# Patient Record
Sex: Male | Born: 1995 | Race: White | Hispanic: No | Marital: Single | Smoking: Current every day smoker
Health system: Southern US, Community
[De-identification: ages and names within clinical notes are randomized; demographics above are authoritative.]

## PROBLEM LIST (undated history)

## (undated) HISTORY — PX: KNEE ARTHROSCOPY: SHX127

## (undated) HISTORY — PX: KNEE SURGERY: SHX244

---
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
## 2018-07-14 ENCOUNTER — Ambulatory Visit (INDEPENDENT_AMBULATORY_CARE_PROVIDER_SITE_OTHER): Payer: Commercial Managed Care - PPO | Admitting: Nurse Practitioner

## 2018-07-14 ENCOUNTER — Encounter: Payer: Self-pay | Admitting: Nurse Practitioner

## 2018-07-14 VITALS — BP 136/87 | HR 62 | Temp 98.3°F | Resp 16 | Ht 71.0 in | Wt 163.0 lb

## 2018-07-14 DIAGNOSIS — F321 Major depressive disorder, single episode, moderate: Secondary | ICD-10-CM

## 2018-07-14 DIAGNOSIS — F5104 Psychophysiologic insomnia: Secondary | ICD-10-CM | POA: Diagnosis not present

## 2018-07-14 DIAGNOSIS — Z7689 Persons encountering health services in other specified circumstances: Secondary | ICD-10-CM | POA: Diagnosis not present

## 2018-07-14 MED ORDER — ESCITALOPRAM OXALATE 10 MG PO TABS: 10.0000 mg | ORAL_TABLET | Freq: Every day | ORAL | 1 refills | Status: DC
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
## 2018-07-14 NOTE — Patient Instructions (Addendum)
Thomas Barber,   Thank you for coming in to clinic today.  1. START escitalopram 10 mg tablet.  Take 1/2 tablet once daily for 7 days.  Then, increase to 1 full tablet once daily and continue.   2. Work on sleep hygiene.  3. Exercise at least 30 minutes 5 days per week.  Please schedule a follow-up appointment with Wilhelmina Mcardle, AGNP.  Return in about 6 weeks (around 08/25/2018) for depression.  If you have any other questions or concerns, please feel free to call the clinic or send a message through MyChart. You may also schedule an earlier appointment if necessary.  You will receive a survey after today's visit either digitally by e-mail or paper by [employer]. Your experiences and feedback matter to [place].  Please respond so we know how we are doing as we provide care for you.  Wilhelmina Mcardle, DNP, AGNP-BC Adult Gerontology Nurse Practitioner [facility]    Sleep Hygiene Tips  Take medicines only as directed by your health care provider.  Keep regular sleeping and waking hours. Avoid naps.  Keep a sleep diary to help you and your health care provider figure out what could be causing your insomnia. Include:  When you sleep.  When you wake up during the night.  How well you sleep.  How rested you feel the next day.  Any side effects of medicines you are taking.  What you eat and drink.  Make your bedroom a comfortable place where it is easy to fall asleep:  Put up shades or special blackout curtains to block light from outside.  Use a white noise machine to block noise.  Keep the temperature cool.  Exercise regularly as directed by your health care provider. Avoid exercising right before bedtime.  Use relaxation techniques to manage stress. Ask your health care provider to suggest some techniques that may work well for you. These may include:  Breathing exercises.  Routines to release muscle tension.  Visualizing peaceful  scenes.  Cut back on alcohol, caffeinated beverages, and cigarettes, especially close to bedtime. These can disrupt your sleep.  Do not overeat or eat spicy foods right before bedtime. This can lead to digestive discomfort that can make it hard for you to sleep.  Limit screen use before bedtime. This includes:  Watching TV.  Using your smartphone, tablet, and computer.  Stick to a routine. This can help you fall asleep faster. Try to do a quiet activity, brush your teeth, and go to bed at the same time each night.  Get out of bed if you are still awake after 15 minutes of trying to sleep. Keep the lights down, but try reading or doing a quiet activity. When you feel sleepy, go back to bed.  Make sure that you drive carefully. Avoid driving if you feel very sleepy.  Keep all follow-up appointments as directed by your health care provider. This is important.     Serotonin Syndrome Serotonin is a brain chemical that regulates the nervous system, which includes the brain, spinal cord, and nerves. Serotonin appears to play a role in all types of behavior, including appetite, emotions, movement, thinking, and response to stress. Excessively high levels of serotonin in the body can cause serotonin syndrome, which is a very dangerous condition. What are the causes? This condition can be caused by taking medicines or drugs that increase the level of serotonin in your body. These include:  Antidepressant medicines.  Migraine medicines.  Certain pain medicines.  Certain recreational drugs, including ecstasy, LSD, cocaine, and amphetamines.  Over-the-counter cough or cold medicines that contain dextromethorphan.  Certain herbal supplements, including St. John's wort, ginseng, and nutmeg.  This condition usually occurs when you take these medicines or drugs in combination, but it can also happen with a high dose of a single medicine or drug. What increases the risk? This condition is  more likely to develop in:  People who have recently increased the dosage of medicine that increases the serotonin level.  People who just started taking medicine that increases the serotonin level.  What are the signs or symptoms? Symptoms of this condition usually happens within several hours of a medicine change. Symptoms include:  Headache.  Muscle twitching or stiffness.  Diarrhea.  Confusion.  Restlessness or agitation.  Shivering or goose bumps.  Loss of muscle coordination.  Rapid heart rate.  Sweating.  Severe cases of serotonin syndromecan cause:  Irregular heartbeat.  Seizures.  Loss of consciousness.  High fever.  How is this diagnosed? This condition is diagnosed with a medical history and physical exam. You will be asked aboutyour symptoms and your use of medicines and recreational drugs. Your health care provider may also order lab work or additional tests to rule out other causes of your symptoms. How is this treated? The treatment for this condition depends on the severity of your symptoms. For mild cases, stopping the medicine that caused your condition is usually all that is needed. For moderate to severe cases, hospitalization is required to monitor you and to prevent further muscle damage. Follow these instructions at home:  Take over-the-counter and prescription medicines only as told by your health care provider. This is important.  Check with your health care provider before you start taking any new prescriptions, over-the-counter medicines, herbs, or supplements.  Avoid combining any medicines that can cause this condition to occur.  Keep all follow-up visits as told by your health care provider.This is important.  Maintain a healthy lifestyle. ? Eat healthy foods. ? Get plenty of sleep. ? Exercise regularly. ? Do not drink alcohol. ? Do not use recreational drugs. Contact a health care provider if:  Medicines do not seem to be  helping.  Your symptoms do not improve or they get worse.  You have trouble taking care of yourself. Get help right away if:  You have worsening confusion, severe headache, chest pain, high fever, seizures, or loss of consciousness.  You have serious thoughts about hurting yourself or others.  You experience serious side effects of medicine, such as swelling of your face, lips, tongue, or throat. This information is not intended to replace advice given to you by your health care provider. Make sure you discuss any questions you have with your health care provider. Document Released: 11/06/2004 Document Revised: 05/24/2016 Document Reviewed: 10/12/2014 Elsevier Interactive Patient Education  [employer].

## 2018-07-15 ENCOUNTER — Encounter (INDEPENDENT_AMBULATORY_CARE_PROVIDER_SITE_OTHER): Payer: Self-pay

## 2018-08-09 ENCOUNTER — Ambulatory Visit: Payer: Commercial Managed Care - PPO | Admitting: Psychology

## 2018-08-24 ENCOUNTER — Other Ambulatory Visit: Payer: Self-pay

## 2018-08-24 ENCOUNTER — Encounter: Payer: Self-pay | Admitting: Nurse Practitioner

## 2018-08-24 ENCOUNTER — Ambulatory Visit (INDEPENDENT_AMBULATORY_CARE_PROVIDER_SITE_OTHER): Payer: Commercial Managed Care - PPO | Admitting: Nurse Practitioner

## 2018-08-24 VITALS — BP 128/69 | HR 71 | Temp 98.3°F | Ht 71.0 in | Wt 164.4 lb

## 2018-08-24 DIAGNOSIS — Z23 Encounter for immunization: Secondary | ICD-10-CM

## 2018-08-24 DIAGNOSIS — F5104 Psychophysiologic insomnia: Secondary | ICD-10-CM

## 2018-08-24 DIAGNOSIS — F321 Major depressive disorder, single episode, moderate: Secondary | ICD-10-CM | POA: Diagnosis not present

## 2018-08-24 MED ORDER — ESCITALOPRAM OXALATE 5 MG PO TABS: 5.0000 mg | ORAL_TABLET | Freq: Every day | ORAL | 2 refills | Status: DC

## 2018-08-24 MED ORDER — HYDROXYZINE HCL 25 MG PO TABS: 12.5000 mg | ORAL_TABLET | Freq: Every evening | ORAL | 1 refills | Status: DC | PRN

## 2018-08-24 NOTE — Progress Notes (Signed)
Subjective:    Patient ID: Thomas Barber, male    DOB: 05/20/1996, 22 y.o.   MRN: [id number]  Thomas Barber is a 22 y.o. male presenting on 08/24/2018 for Depression   HPI Depression and Insomnia - Doesn't want to do anything - Feels like he zones out and doesn't have to be present, no interest in things he does - Quickly gets bored with things especially that are mastered.   - Job is not any longer challenging. - Is becoming more anxious.   - Notes an occasional sense of impending doom at work.  - Mind continues racing at bedtime.  Doesn't notice any difficulty during the day.  This prevents good sleep and has difficulty with sleep onset and going back to sleep with nighttime awakenings. - Started Lexapro and has had severe diarrhea with increase to 10 mg daily.  He has taken this only 2-3 per week and is not willing to continue if diarrhea continues.  Depression screen [employer] 2/9 08/24/2018 07/14/2018  Decreased Interest 2 3  Down, Depressed, Hopeless 2 2  PHQ - 2 Score 4 5  Altered sleeping 3 3  Tired, decreased energy 1 3  Change in appetite 0 1  Feeling bad or failure about yourself  1 2  Trouble concentrating 2 1  Moving slowly or fidgety/restless 2 2  Suicidal thoughts 0 1  PHQ-9 Score 13 18  Difficult doing work/chores Very difficult Somewhat difficult    GAD 7 : Generalized Anxiety Score 08/24/2018 08/24/2018 07/14/2018  Nervous, Anxious, on Edge - 1 0  Control/stop worrying - 1 1  Worry too much - different things - 1 0  Trouble relaxing 1 - 2  Restless - 0 0  Easily annoyed or irritable - 3 1  Afraid - awful might happen 2 2 0  Total GAD 7 Score - - 4  Anxiety Difficulty - Somewhat difficult Not difficult at all      Social History   Tobacco Use  . Smoking status: Current Every Day Smoker  . Smokeless tobacco: Current User  Substance Use Topics  . Alcohol use: Yes  . Drug use: Yes    Types: Marijuana    Review of Systems Per HPI unless  specifically indicated above     Objective:    BP 128/69 (BP Location: Right Arm, Patient Position: Sitting, Cuff Size: Normal)   Pulse 71   Temp 98.3 F (36.8 C) (Oral)   Ht 5\' 11"  (1.803 m)   Wt 164 lb 6.4 oz (74.6 kg)   BMI 22.93 kg/m   Wt Readings from Last 3 Encounters:  08/24/18 164 lb 6.4 oz (74.6 kg)  07/14/18 163 lb (73.9 kg)    Physical Exam  Constitutional: He is oriented to person, place, and time. He appears well-developed and well-nourished. No distress.  HENT:  Head: Normocephalic and atraumatic.  Cardiovascular: Normal rate, regular rhythm, S1 normal, S2 normal, normal heart sounds and intact distal pulses.  Pulmonary/Chest: Effort normal and breath sounds normal. No respiratory distress.  Neurological: He is alert and oriented to person, place, and time.  Skin: Skin is warm and dry. Capillary refill takes less than 2 seconds.  Psychiatric: Judgment and thought content normal. He is withdrawn. He exhibits a depressed mood. He expresses no homicidal and no suicidal ideation. He expresses no suicidal plans and no homicidal plans. He is communicative.  Patient is mildly less depressed and is less withdrawn.  Both continue to be predominant mood and  behaviors. He is attentive.  Vitals reviewed.      Assessment & Plan:   Problem List Items Addressed This Visit      Other   Current moderate episode of major depressive disorder without prior episode (HCC) - Primary Small improvement in depression, but continues to be uncontrolled and without fully tolerating lexapro.  Not consistently dosing this.  Resolution of SI.  Denies SI/HI and has no plans to carry out if SI/HI arise.   Plan: 1. REDUCE lexapro to 5 mg once daily - take daily and report back on diarrhea.  Can increase back to 10 mg daily if tolerated.  If not tolerated by GI system, will need alternative agent. 2. For increased anxiety, start hydroxyzine 25 mg 1/2-1 tab tidprn anxiety. 3. Encouraged regular  physical activity, hobbies as non-pharm ways to increase mood/outlook. 4. Follow-up 6 weeks.   Relevant Medications   escitalopram (LEXAPRO) 5 MG tablet   hydrOXYzine (ATARAX/VISTARIL) 25 MG tablet   Psychophysiological insomnia Stable, but not improving with mild improvement in depression.  Largely impacted by anxiety.  Plan: 1. START hydroxyzine as above for anxiety.  Instructed patient to take full 25 mg tab at bedtime to help with sleep onset prn. 2. Encouraged good sleep hygiene. 3. Encouraged exercise. 4. Follow-up 6 weeks.   Relevant Medications   hydrOXYzine (ATARAX/VISTARIL) 25 MG tablet    Other Visit Diagnoses    Needs flu shot     Pt < age 97.  Needs annual influenza vaccine.  Plan: 1. Administer Quad flu vaccine.    Relevant Orders   Flu Vaccine QUAD 6+ mos PF IM (Fluarix Quad PF) (Completed)      Meds ordered this encounter  Medications  . escitalopram (LEXAPRO) 5 MG tablet    Sig: Take 1 tablet (5 mg total) by mouth daily.    Dispense:  30 tablet    Refill:  2    Order Specific Question:   Supervising Provider    Answer:   Smitty Cords [[id number]]  . hydrOXYzine (ATARAX/VISTARIL) 25 MG tablet    Sig: Take 0.5-1 tablets (12.5-25 mg total) by mouth at bedtime as needed for anxiety (and sleep).    Dispense:  30 tablet    Refill:  1    Order Specific Question:   Supervising Provider    Answer:   Smitty Cords [[id number]]    Follow up plan: Return in about 6 weeks (around 10/05/2018) for depression.  Wilhelmina Mcardle, DNP, AGPCNP-BC Adult Gerontology Primary Care Nurse Practitioner [facility] 08/24/2018, 4:43 PM

## 2018-08-24 NOTE — Patient Instructions (Addendum)
Thomas Barber,   Thank you for coming in to clinic today.  1. For anxiety, start hydroxyzine 12.5 mg once daily.  2. For anxiety and depression, REDUCE lexapro to 5 mg tablet.  Take one tablet daily in the morning.  Take until you have no increased bowel movements.   Once you are taking your medicine daily and tolerating well, call clinic or send a mychart message for additional instructions for dose increases. - Increase your physical activity until you are increasing your heart rate for 30 minutes on most days of the week. - Start working toward your hobby of music again since that was previously enjoyable.  Please schedule a follow-up appointment with Wilhelmina Mcardle, AGNP. Return in about 6 weeks (around 10/05/2018) for depression.  If you have any other questions or concerns, please feel free to call the clinic or send a message through MyChart. You may also schedule an earlier appointment if necessary.  You will receive a survey after today's visit either digitally by e-mail or paper by [employer]. Your experiences and feedback matter to [place].  Please respond so we know how we are doing as we provide care for you.   Wilhelmina Mcardle, DNP, AGNP-BC Adult Gerontology Nurse Practitioner [facility], [facility]   Major Depressive Disorder, Adult Major depressive disorder (MDD) is a mental health condition. MDD often makes you feel sad, hopeless, or helpless. MDD can also cause symptoms in your body. MDD can affect your:  Work.  School.  Relationships.  Other normal activities.  MDD can range from mild to very bad. It may occur once (single episode MDD). It can also occur many times (recurrent MDD). The main symptoms of MDD often include:  Feeling sad, depressed, or irritable most of the time.  Loss of interest.  MDD symptoms also include:  Sleeping too much or too little.  Eating too much or too little.  A change in your weight.  Feeling tired (fatigue) or  having low energy.  Feeling worthless.  Feeling guilty.  Trouble making decisions.  Trouble thinking clearly.  Thoughts of suicide or harming others.  Feeling weak.  Feeling agitated.  Keeping yourself from being around other people (isolation).  Follow these instructions at home: Activity  Do these things as told by your doctor: ? Go back to your normal activities. ? Exercise regularly. ? Spend time outdoors. Alcohol  Talk with your doctor about how alcohol can affect your antidepressant medicines.  Do not drink alcohol. Or, limit how much alcohol you drink. ? This means no more than 1 drink a day for nonpregnant women and 2 drinks a day for men. One drink equals one of these:  12 oz of beer.  5 oz of wine.  1 oz of hard liquor. General instructions  Take over-the-counter and prescription medicines only as told by your doctor.  Eat a healthy diet.  Get plenty of sleep.  Find activities that you enjoy. Make time to do them.  Think about joining a support group. Your doctor may be able to suggest a group for you.  Keep all follow-up visits as told by your doctor. This is important. Where to find more information:  [employer] on Mental Illness: ? www.nami.org  [place] [employer] of Mental Health: ? http://www.maynard.net/  National Suicide Prevention Lifeline: ? 831-875-5521. This is free, 24-hour help. Contact a doctor if:  Your symptoms get worse.  You have new symptoms. Get help right away if:  You self-harm.  You see, hear, taste, smell, or feel things that are not present (hallucinate). If you ever feel like you may hurt yourself or others, or have thoughts about taking your own life, get help right away. You can go to your nearest emergency department or call:  Your local emergency services (911 in the [place]  A suicide crisis helpline, such as the National Suicide Prevention Lifeline: ? 205-007-98751-(360)718-7074. This is open 24 hours a  day.  This information is not intended to replace advice given to you by your health care provider. Make sure you discuss any questions you have with your health care provider. Document Released: 09/10/2015 Document Revised: 06/15/2016 Document Reviewed: 06/15/2016 Elsevier Interactive Patient Education  2017 [employer]

## 2018-08-26 ENCOUNTER — Ambulatory Visit (INDEPENDENT_AMBULATORY_CARE_PROVIDER_SITE_OTHER): Payer: Commercial Managed Care - PPO | Admitting: Psychology

## 2018-08-26 DIAGNOSIS — F332 Major depressive disorder, recurrent severe without psychotic features: Secondary | ICD-10-CM

## 2018-08-27 ENCOUNTER — Encounter: Payer: Self-pay | Admitting: Nurse Practitioner

## 2018-08-27 DIAGNOSIS — F321 Major depressive disorder, single episode, moderate: Secondary | ICD-10-CM | POA: Insufficient documentation

## 2018-08-27 DIAGNOSIS — F5104 Psychophysiologic insomnia: Secondary | ICD-10-CM | POA: Insufficient documentation

## 2018-09-08 ENCOUNTER — Encounter: Payer: Self-pay | Admitting: Nurse Practitioner

## 2018-09-23 ENCOUNTER — Ambulatory Visit (INDEPENDENT_AMBULATORY_CARE_PROVIDER_SITE_OTHER): Payer: Commercial Managed Care - PPO | Admitting: Psychology

## 2018-09-23 DIAGNOSIS — F3289 Other specified depressive episodes: Secondary | ICD-10-CM

## 2018-10-14 ENCOUNTER — Ambulatory Visit: Payer: Self-pay | Admitting: Psychology

## 2019-04-27 DIAGNOSIS — M940 Chondrocostal junction syndrome [Tietze]: Secondary | ICD-10-CM | POA: Insufficient documentation

## 2019-04-28 DIAGNOSIS — F418 Other specified anxiety disorders: Secondary | ICD-10-CM | POA: Insufficient documentation

## 2019-04-28 DIAGNOSIS — R4589 Other symptoms and signs involving emotional state: Secondary | ICD-10-CM | POA: Insufficient documentation

## 2020-09-10 ENCOUNTER — Other Ambulatory Visit: Payer: Self-pay

## 2020-09-10 ENCOUNTER — Encounter: Payer: Self-pay | Admitting: Podiatry

## 2020-09-10 ENCOUNTER — Ambulatory Visit (INDEPENDENT_AMBULATORY_CARE_PROVIDER_SITE_OTHER): Payer: Commercial Managed Care - PPO | Admitting: Podiatry

## 2020-09-10 DIAGNOSIS — L600 Ingrowing nail: Secondary | ICD-10-CM

## 2020-09-10 DIAGNOSIS — L608 Other nail disorders: Secondary | ICD-10-CM

## 2020-09-10 MED ORDER — NEOMYCIN-POLYMYXIN-HC 3.5-10000-1 OT SUSP: OTIC | 0 refills | Status: DC

## 2020-09-10 NOTE — Patient Instructions (Signed)

## 2020-09-10 NOTE — Progress Notes (Signed)
    Subjective:  Patient ID: Thomas Barber, male    DOB: 1995/12/03,  MRN: [id number]  Chief Complaint  Patient presents with  . Nail Problem    Patient presents today for ingrown toenails bilat hallux right worse than left    24 y.o. male presents with the above complaint. History confirmed with patient.   Objective:  Physical Exam: warm, good capillary refill, no trophic changes or ulcerative lesions, normal DP and PT pulses and normal sensory exam. 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 Ingrowing nails medial hallux b/l  Assessment:   1. Ingrowing left great toenail   2. Ingrowing right great toenail      Plan:  Patient was evaluated and treated and all questions answered.    Ingrown Nail, bilaterally -Patient elects to proceed with minor surgery to remove ingrown toenail today. Consent reviewed and signed by patient. -Ingrown nail excised. See procedure note. -Educated on post-procedure care including soaking. Written instructions provided and reviewed. -Patient to follow up in 2 weeks for nail check.  Procedure: Excision of Ingrown Toenail Location: Bilateral 1st toe medial nail borders. Anesthesia: Lidocaine 1% plain; 1.5 mL and Marcaine 0.5% plain; 1.5 mL, digital block. Skin Prep: Betadine. Dressing: Silvadene; telfa; dry, sterile, compression dressing. Technique: Following skin prep, the toe was exsanguinated and a tourniquet was secured at the base of the toe. The affected nail border was freed, split with a nail splitter, and excised. Chemical matrixectomy was then performed with phenol and irrigated out with alcohol. The tourniquet was then removed and sterile dressing applied. Disposition: Patient tolerated procedure well. Patient to return in 2 weeks for follow-up.     Return in about 2 weeks (around 09/24/2020).

## 2021-03-29 ENCOUNTER — Ambulatory Visit (INDEPENDENT_AMBULATORY_CARE_PROVIDER_SITE_OTHER): Payer: Commercial Managed Care - PPO | Admitting: Internal Medicine

## 2021-03-29 ENCOUNTER — Other Ambulatory Visit (HOSPITAL_COMMUNITY)
Admission: RE | Admit: 2021-03-29 | Discharge: 2021-03-29 | Disposition: A | Discharge status: Home / Self Care | Payer: Commercial Managed Care - PPO | Source: Ambulatory Visit | Attending: Internal Medicine | Admitting: Internal Medicine

## 2021-03-29 ENCOUNTER — Other Ambulatory Visit: Payer: Self-pay

## 2021-03-29 ENCOUNTER — Encounter: Payer: Self-pay | Admitting: Internal Medicine

## 2021-03-29 VITALS — BP 148/88 | HR 81 | Temp 98.2°F | Resp 18 | Ht 71.0 in | Wt 184.8 lb

## 2021-03-29 DIAGNOSIS — Z113 Encounter for screening for infections with a predominantly sexual mode of transmission: Secondary | ICD-10-CM

## 2021-03-29 NOTE — Patient Instructions (Signed)
Safe Sex Practicing safe sex means taking steps before and during sex to reduce your risk of: Getting an STI (sexually transmitted infection). Giving your partner an STI. Unwanted or unplanned pregnancy. How to practice safe sex Ways you can practice safe sex  Limit your sexual partners to only one partner who is having sex with only you. Avoid using alcohol and drugs before having sex. Alcohol and drugs can affect your judgment. Before having sex with a new partner: Talk to your partner about past partners, past STIs, and drug use. Get screened for STIs and discuss the results with your partner. Ask your partner to get screened too. Check your body regularly for sores, blisters, rashes, or unusual discharge. If you notice any of these problems, visit your health care provider. Avoid sexual contact if you have symptoms of an infection or you are being treated for an STI. While having sex, use a condom. Make sure to: Use a condom every time you have vaginal, oral, or anal sex. Both females and males should wear condoms during oral sex. Keep condoms in place from the beginning to the end of sexual activity. Use a latex condom, if possible. Latex condoms offer the best protection. Use only water-based lubricants with a condom. Using petroleum-based lubricants or oils will weaken the condom and increase the chance that it will break. Ways your health care provider can help you practice safe sex  See your health care provider for regular screenings, exams, and tests for STIs. Talk with your health care provider about what kind of birth control (contraception) is best for you. Get vaccinated against hepatitis B and human papillomavirus (HPV). If you are at risk of being infected with HIV (human immunodeficiency virus), talk with your health care provider about taking a prescription medicine to prevent HIV infection. You are at risk for HIV if you: Are a man who has sex with other men. Are  sexually active with more than one partner. Take drugs by injection. Have a sex partner who has HIV. Have unprotected sex. Have sex with someone who has sex with both men and women. Have had an STI. Follow these instructions at home: Take over-the-counter and prescription medicines only as told by your health care provider. Keep all follow-up visits. This is important. Where to find more information Centers for Disease Control and Prevention: www.cdc.gov Planned Parenthood: www.plannedparenthood.org Office on Women's Health: www.womenshealth.gov Summary Practicing safe sex means taking steps before and during sex to reduce your risk getting an STI, giving your partner an STI, and having an unwanted or unplanned pregnancy. Before having sex with a new partner, talk to your partner about past partners, past STIs, and drug use. Use a condom every time you have vaginal, oral, or anal sex. Both females and males should wear condoms during oral sex. Check your body regularly for sores, blisters, rashes, or unusual discharge. If you notice any of these problems, visit your health care provider. See your health care provider for regular screenings, exams, and tests for STIs. This information is not intended to replace advice given to you by your health care provider. Make sure you discuss any questions you have with your health care provider. Document Revised: 03/05/2020 Document Reviewed: 03/05/2020 Elsevier Patient Education  2022 Elsevier Inc.  

## 2021-03-29 NOTE — Progress Notes (Signed)
Subjective:    Patient ID: Thomas Barber, male    DOB: [birth year], 25 y.o.   MRN: [id number]  HPI  Pt presents to the clinic today requesting STD testing. He reports he was told that he was possibly exposed to genital herpes. He is not having any symptoms. He denies lesions or ulcerations. He denies urgency, frequency, dysuria, blood in his urine, testicular pain or swelling. He has never been STD tested before. He is sexually active.  Review of Systems  No past medical history on file.  Current Outpatient Medications  Medication Sig Dispense Refill   neomycin-polymyxin-hydrocortisone (CORTISPORIN) 3.5-10000-1 OTIC suspension Apply 1-2 drops daily after soaking and cover with bandaid 10 mL 0   No current facility-administered medications for this visit.    No Known Allergies  Family History  Problem Relation Age of Onset   Hypertension Mother    Hypertension Father    Healthy Sister    Lung cancer Maternal Grandmother    Heart attack Maternal Grandfather    Parkinson's disease Paternal Grandmother    Heart attack Paternal Grandfather    Diabetes Paternal Grandfather    Healthy Sister    Healthy Sister     Social History   Socioeconomic History   Marital status: Single    Spouse name: Not on file   Number of children: Not on file   Years of education: Not on file   Highest education level: Some college, no degree  Occupational History   Not on file  Tobacco Use   Smoking status: Every Day    Pack years: 0.00   Smokeless tobacco: Current  Vaping Use   Vaping Use: Former  Substance and Sexual Activity   Alcohol use: Yes   Drug use: Yes    Types: Marijuana   Sexual activity: Yes    Birth control/protection: Other-see comments    Comment: Partner birth control  Other Topics Concern   Not on file  Social History Narrative   Not on file   Social Determinants of Health   Financial Resource Strain: Not on file  Food Insecurity: Not on file  Transportation  Needs: Not on file  Physical Activity: Not on file  Stress: Not on file  Social Connections: Not on file  Intimate Partner Violence: Not on file     Constitutional: Denies fever, malaise, fatigue, headache or abrupt weight changes.  Respiratory: Denies difficulty breathing, shortness of breath, cough or sputum production.   Cardiovascular: Denies chest pain, chest tightness, palpitations or swelling in the hands or feet.  Gastrointestinal: Denies abdominal pain, bloating, constipation, diarrhea or blood in the stool.  GU: Denies urgency, frequency, pain with urination, burning sensation, blood in urine, odor or discharge. Skin: Denies redness, rashes, lesions or ulcercations.   No other specific complaints in a complete review of systems (except as listed in HPI above).     Objective:   Physical Exam BP (!) 148/88 (BP Location: Right Arm, Patient Position: Sitting, Cuff Size: Normal)   Pulse 81   Temp 98.2 F (36.8 C) (Temporal)   Resp 18   Ht 5\' 11"  (1.803 m)   Wt 184 lb 12.8 oz (83.8 kg)   SpO2 99%   BMI 25.77 kg/m   Wt Readings from Last 3 Encounters:  08/24/18 164 lb 6.4 oz (74.6 kg)  07/14/18 163 lb (73.9 kg)    General: Appears his stated age, well developed, well nourished in NAD. Skin: Warm, dry and intact. No rashes noted. Cardiovascular:  Normal rate. Pulmonary/Chest: Normal effort. Neurological: Alert and oriented. Cranial nerves II-XII grossly intact. Coordination normal.  Psychiatric: Mood and affect normal. Behavior is normal. Judgment and thought content normal.     BMET    Component Value Date/Time   NA 140 06/09/2012 1711   K 3.8 06/09/2012 1711   CL 105 06/09/2012 1711   CO2 30 (H) 06/09/2012 1711   GLUCOSE 90 06/09/2012 1711   BUN 12 06/09/2012 1711   CREATININE 1.07 06/09/2012 1711   CALCIUM 9.5 06/09/2012 1711    Lipid Panel  No results found for: CHOL, TRIG, HDL, CHOLHDL, VLDL, LDLCALC  CBC    Component Value Date/Time   WBC 6.6  06/09/2012 1711   RBC 4.74 06/09/2012 1711   HGB 15.5 06/09/2012 1711   HCT 45.0 06/09/2012 1711   PLT 167 06/09/2012 1711   MCV 95 06/09/2012 1711   MCH 32.8 06/09/2012 1711   MCHC 34.5 06/09/2012 1711   RDW 12.5 06/09/2012 1711   LYMPHSABS 1.7 06/09/2012 1711   MONOABS 0.5 06/09/2012 1711   EOSABS 0.1 06/09/2012 1711   BASOSABS 0.0 06/09/2012 1711    Hgb A1C No results found for: HGBA1C         Assessment & Plan:   Screen for STD:  Will check HIV, RPR, Hep C, HSV 1&2 IgG ang IgM Will check urine gonorrhea, chlamydia and trich Encourage safe sexual practices  Return precautions discussed   Nicki Reaper, NP This visit occurred during the SARS-CoV-2 public health emergency.  Safety protocols were in place, including screening questions prior to the visit, additional usage of staff PPE, and extensive cleaning of exam room while observing appropriate contact time as indicated for disinfecting solutions.

## 2021-04-02 LAB — URINE CYTOLOGY ANCILLARY ONLY
Chlamydia: NEGATIVE
Comment: NEGATIVE
Comment: NEGATIVE
Comment: NORMAL
Neisseria Gonorrhea: NEGATIVE
Trichomonas: NEGATIVE

## 2021-04-03 ENCOUNTER — Telehealth: Payer: Self-pay

## 2021-04-03 NOTE — Telephone Encounter (Signed)
Copied from CRM [id number]. Topic: General - Other >> Apr 03, 2021 10:40 AM Gwenlyn Fudge wrote: Reason for CRM: Pt called stating that he is needing to have a nurse give a call back to go over his lab results. Please advise.  The pt was notified of his results. He verbalize understanding, no questions or concerns.

## 2021-04-05 LAB — HEPATITIS C ANTIBODY
Hepatitis C Ab: NONREACTIVE
SIGNAL TO CUT-OFF: 0 (ref <1.00)

## 2021-04-05 LAB — HSV(HERPES SIMPLEX VRS) I + II AB-IGG
HAV 1 IGG,TYPE SPECIFIC AB: <0.9 index
HSV 2 IGG,TYPE SPECIFIC AB: <0.9 index

## 2021-04-05 LAB — HSV 1/2 AB (IGM), IFA W/RFLX TITER
HSV 1 IgM Screen: NEGATIVE
HSV 2 IgM Screen: NEGATIVE

## 2021-04-05 LAB — RPR: RPR Ser Ql: NONREACTIVE

## 2021-04-05 LAB — HIV ANTIBODY (ROUTINE TESTING W REFLEX): HIV 1&2 Ab, 4th Generation: NONREACTIVE

## 2021-04-05 LAB — EXTRA LAV TOP TUBE

## 2021-07-17 ENCOUNTER — Emergency Department (HOSPITAL_COMMUNITY)
Admission: EM | Admit: 2021-07-17 | Discharge: 2021-07-17 | Disposition: A | Discharge status: Home / Self Care | Payer: Commercial Managed Care - PPO | Attending: Emergency Medicine | Admitting: Emergency Medicine

## 2021-07-17 ENCOUNTER — Other Ambulatory Visit: Payer: Self-pay

## 2021-07-17 ENCOUNTER — Encounter (HOSPITAL_COMMUNITY): Payer: Self-pay | Admitting: Oncology

## 2021-07-17 DIAGNOSIS — H6002 Abscess of left external ear: Secondary | ICD-10-CM | POA: Insufficient documentation

## 2021-07-17 DIAGNOSIS — F17210 Nicotine dependence, cigarettes, uncomplicated: Secondary | ICD-10-CM | POA: Diagnosis not present

## 2021-07-17 DIAGNOSIS — L0291 Cutaneous abscess, unspecified: Secondary | ICD-10-CM

## 2021-07-17 MED ORDER — LIDOCAINE HCL 2 % IJ SOLN
20.0000 mL | Freq: Once | INTRAMUSCULAR | Status: AC
  Administered 2021-07-17: 400 mg via INTRADERMAL
  Filled 2021-07-17: qty 20

## 2021-07-17 MED ORDER — DOXYCYCLINE HYCLATE 100 MG PO CAPS: 100.0000 mg | ORAL_CAPSULE | Freq: Two times a day (BID) | ORAL | 0 refills | Status: DC

## 2021-07-17 NOTE — ED Provider Notes (Signed)
Emergency Medicine Provider Triage Evaluation Note  Thomas Barber , a 25 y.o. male  was evaluated in triage.  Pt complains of left ear pain and swelling that started about 2 days ago.  Reports a history of gauges in the ears.  States he removed the gauges and after the ear closed he developed a keloid in the region.  He began developing pain and swelling along the left lobule.  He was evaluated at urgent care and started on amoxicillin which has provided no relief.  Denies any fevers, nausea, vomiting.  No drainage from the ear.  Physical Exam  BP (!) 158/99 (BP Location: Left Wrist)   Pulse 84   Temp 98.3 F (36.8 C) (Oral)   Resp 18   SpO2 99%  Gen:   Awake, no distress   Resp:  Normal effort  MSK:   Moves extremities without difficulty  Other:  Erythema and edema noted to the left lobule.  Fluctuance noted along the posterior aspect of the lobule.  Medical Decision Making  Medically screening exam initiated at 3:20 PM.  Appropriate orders placed.  [name] was informed that the remainder of the evaluation will be completed by another provider, this initial triage assessment does not replace that evaluation, and the importance of remaining in the ED until their evaluation is complete.   Placido Sou, PA-C 07/17/21 1522    Lorre Nick, MD 07/18/21 1001

## 2021-07-17 NOTE — Discharge Instructions (Signed)
Please read and follow all provided instructions.  Your diagnoses today include:  1. Abscess     Tests performed today include: Vital signs. See below for your results today.   Medications prescribed:  Doxycycline - antibiotic  You have been prescribed an antibiotic medicine: take the entire course of medicine even if you are feeling better. Stopping early can cause the antibiotic not to work.  Take any prescribed medications only as directed.   Home care instructions:  Follow any educational materials contained in this packet  Follow-up instructions: Return to the Emergency Department in 48 hours for a recheck if your symptoms are not significantly improved.  Return instructions:  Return to the Emergency Department if you have: Fever Worsening symptoms Worsening pain Worsening swelling Redness of the skin that moves away from the affected area, especially if it streaks away from the affected area  Any other emergent concerns  Your vital signs today were: BP (!) 158/99 (BP Location: Left Wrist)   Pulse 84   Temp 98.3 F (36.8 C) (Oral)   Resp 18   Ht 6' (1.829 m)   Wt 81.6 kg   SpO2 99%   BMI 24.41 kg/m  If your blood pressure (BP) was elevated above 135/85 this visit, please have this repeated by your doctor within one month. --------------

## 2021-07-17 NOTE — ED Provider Notes (Signed)
[facility]-EMERGENCY DEPT Provider Note   CSN: [id number] Arrival date & time: 07/17/21  1447     History Chief Complaint  Patient presents with   Otalgia    KRIST ROSENBOOM is a 25 y.o. male.  Patient presents to the emergency department for evaluation of swelling to left earlobe.  Patient states that he has had a gauge in at the past and had a swollen area after the area healed.  Symptoms have been ongoing for several days.  He was seen in urgent care and started on amoxicillin.  He has had about 3-1/2 days of this antibiotic.  No fevers, nausea or vomiting.  No drainage from the ear.  Area is tender.  He states that he was referred to a dermatologist but will be unable to see them for another week and wanted to get the area treated sooner.      History reviewed. No pertinent past medical history.  Patient Active Problem List   Diagnosis Date Noted   Anxiety about health 04/28/2019   Current moderate episode of major depressive disorder without prior episode (HCC) 08/27/2018   Psychophysiological insomnia 08/27/2018    Past Surgical History:  Procedure Laterality Date   KNEE SURGERY         Family History  Problem Relation Age of Onset   Hypertension Mother    Hypertension Father    Healthy Sister    Lung cancer Maternal Grandmother    Heart attack Maternal Grandfather    Parkinson's disease Paternal Grandmother    Heart attack Paternal Grandfather    Diabetes Paternal Grandfather    Healthy Sister    Healthy Sister     Social History   Tobacco Use   Smoking status: Every Day    Packs/day: 1.00    Types: Cigarettes   Smokeless tobacco: Current  Vaping Use   Vaping Use: Former  Substance Use Topics   Alcohol use: Yes   Drug use: Yes    Types: Marijuana    Home Medications Prior to Admission medications   Not on File    Allergies    Patient has no known allergies.  Review of Systems   Review of Systems  Constitutional:   Negative for fever.  Gastrointestinal:  Negative for nausea and vomiting.  Skin:  Negative for color change.       Positive for abscess  Hematological:  Negative for adenopathy.   Physical Exam Updated Vital Signs BP (!) 158/99 (BP Location: Left Wrist)   Pulse 84   Temp 98.3 F (36.8 C) (Oral)   Resp 18   Ht 6' (1.829 m)   Wt 81.6 kg   SpO2 99%   BMI 24.41 kg/m   Physical Exam Vitals and nursing note reviewed.  Constitutional:      Appearance: He is well-developed.  HENT:     Head: Normocephalic and atraumatic.     Right Ear: External ear normal.     Left Ear: Tympanic membrane, ear canal and external ear normal.     Ears:     Comments: 1 cm area of fluctuance consistent with abscess posterior to the earlobe at the crease of the neck and the ear Eyes:     Conjunctiva/sclera: Conjunctivae normal.  Pulmonary:     Effort: No respiratory distress.  Musculoskeletal:     Cervical back: Normal range of motion and neck supple.  Skin:    General: Skin is warm and dry.  Neurological:  Mental Status: He is alert.    ED Results / Procedures / Treatments   Labs (all labs ordered are listed, but only abnormal results are displayed) Labs Reviewed - No data to display  EKG None  Radiology No results found.  Procedures .Marland KitchenIncision and Drainage  Date/Time: 07/17/2021 4:40 PM Performed by: Renne Crigler, PA-C Authorized by: Renne Crigler, PA-C   Consent:    Consent obtained:  Verbal   Consent given by:  Patient   Risks discussed:  Pain and bleeding   Alternatives discussed:  No treatment Universal protocol:    Patient identity confirmed:  Verbally with patient Location:    Type:  Abscess   Size:  1cm   Location:  Head   Head location:  L external ear Pre-procedure details:    Skin preparation:  Povidone-iodine Sedation:    Sedation type:  None Anesthesia:    Anesthesia method:  Local infiltration   Local anesthetic:  Lidocaine 2% w/o epi Procedure type:     Complexity:  Simple Procedure details:    Needle aspiration: yes     Needle size:  18 G   Drainage:  Purulent   Drainage amount:  Moderate   Packing materials:  None Post-procedure details:    Procedure completion:  Tolerated well, no immediate complications   Medications Ordered in ED Medications  lidocaine (XYLOCAINE) 2 % (with pres) injection 400 mg (has no administration in time range)    ED Course  I have reviewed the triage vital signs and the nursing notes.  Pertinent labs & imaging results that were available during my care of the patient were reviewed by me and considered in my medical decision making (see chart for details).  Patient seen and examined.  Discussed drainage.  Would prefer attempted needle aspiration given location on the ear.    Needle aspiration performed without complication with good result.  Vital signs reviewed and are as follows: BP (!) 158/99 (BP Location: Left Wrist)   Pulse 84   Temp 98.3 F (36.8 C) (Oral)   Resp 18   Ht 6' (1.829 m)   Wt 81.6 kg   SpO2 99%   BMI 24.41 kg/m   We will have patient discontinue amoxicillin and start doxycycline due to suppurative infection.     MDM Rules/Calculators/A&P                           Earlobe abscess.  Aspiration performed.  Patient otherwise appears well.  Due to abscess, changed amoxicillin to doxycycline.    Final Clinical Impression(s) / ED Diagnoses Final diagnoses:  Abscess    Rx / DC Orders ED Discharge Orders     None        Renne Crigler, PA-C 07/17/21 1642    Gloris Manchester, MD 07/18/21 1353

## 2021-07-17 NOTE — ED Triage Notes (Signed)
Pt reports that left ear is swollen.  Seen at urgent care and given antibiotics.  Pt states the outside of his ear is swelling to the point that it feels like he has his gauges back in.

## 2021-09-16 ENCOUNTER — Encounter: Payer: Self-pay | Admitting: Surgery

## 2021-09-16 ENCOUNTER — Other Ambulatory Visit: Payer: Self-pay

## 2021-09-16 ENCOUNTER — Ambulatory Visit (INDEPENDENT_AMBULATORY_CARE_PROVIDER_SITE_OTHER): Payer: Commercial Managed Care - PPO | Admitting: Surgery

## 2021-09-16 ENCOUNTER — Ambulatory Visit
Admission: RE | Admit: 2021-09-16 | Discharge: 2021-09-16 | Disposition: A | Discharge status: Home / Self Care | Payer: Commercial Managed Care - PPO | Source: Ambulatory Visit | Attending: Surgery | Admitting: Surgery

## 2021-09-16 ENCOUNTER — Other Ambulatory Visit
Admission: RE | Admit: 2021-09-16 | Discharge: 2021-09-16 | Disposition: A | Discharge status: Home / Self Care | Payer: Commercial Managed Care - PPO | Source: Home / Self Care | Attending: Surgery | Admitting: Surgery

## 2021-09-16 ENCOUNTER — Other Ambulatory Visit: Payer: Self-pay | Admitting: Surgery

## 2021-09-16 VITALS — BP 136/83 | HR 96 | Temp 98.1°F | Ht 72.0 in | Wt 179.6 lb

## 2021-09-16 DIAGNOSIS — R599 Enlarged lymph nodes, unspecified: Secondary | ICD-10-CM

## 2021-09-16 DIAGNOSIS — L910 Hypertrophic scar: Secondary | ICD-10-CM | POA: Diagnosis not present

## 2021-09-16 DIAGNOSIS — H9222 Otorrhagia, left ear: Secondary | ICD-10-CM

## 2021-09-16 LAB — CBC WITH DIFFERENTIAL/PLATELET
Abs Immature Granulocytes: 0.03 10*3/uL (ref 0.00–0.07)
Basophils Absolute: 0.1 10*3/uL (ref 0.0–0.1)
Basophils Relative: 1 %
Eosinophils Absolute: 0.1 10*3/uL (ref 0.0–0.5)
Eosinophils Relative: 2 %
HCT: 45.6 % (ref 39.0–52.0)
Hemoglobin: 16.1 g/dL (ref 13.0–17.0)
Immature Granulocytes: 0 %
Lymphocytes Relative: 27 %
Lymphs Abs: 2.2 10*3/uL (ref 0.7–4.0)
MCH: 32.9 pg (ref 26.0–34.0)
MCHC: 35.3 g/dL (ref 30.0–36.0)
MCV: 93.1 fL (ref 80.0–100.0)
Monocytes Absolute: 0.7 10*3/uL (ref 0.1–1.0)
Monocytes Relative: 9 %
Neutro Abs: 4.9 10*3/uL (ref 1.7–7.7)
Neutrophils Relative %: 61 %
Platelets: 206 10*3/uL (ref 150–400)
RBC: 4.9 MIL/uL (ref 4.22–5.81)
RDW: 11.9 % (ref 11.5–15.5)
WBC: 7.9 10*3/uL (ref 4.0–10.5)
nRBC: 0 % (ref 0.0–0.2)

## 2021-09-16 NOTE — Patient Instructions (Addendum)
Please call our office if you have any questions or concerns.   Please go to [facility] for labs to be drawn, We also have you scheduled for an appointment to have a ultrasound at [facility]. Go there now and they are going to work you in. Please Check in at [facility] will call you to set up an appointment.

## 2021-09-16 NOTE — Progress Notes (Signed)
Patient ID: Thomas Barber, male   DOB: Jan 05, 1996, 25 y.o.   MRN: [id number]  HPI Thomas Barber is a 25 y.o. male [name].  He has had a history of recurrent left earlobe infection and most recently required drainage by the ED about a month ago.  He now comes with some chronic scaring to the left posterior earlobe.  Does not hurt.  He did have piercings in the past.  He denies any fevers any chills.  There is really no pain in the left ear.  He does have some occasional bleeding from the ear canal. In addition to that he reports over the last couple months he is fell a potential nodule within the right axilla.  No fevers no chills no B type symptoms.  No family history of breast cancer no history of lymphoma in the family. Did have hepatitis and RPR serology that was negative.  HPI  History reviewed. No pertinent past medical history.  Past Surgical History:  Procedure Laterality Date   KNEE ARTHROSCOPY     KNEE SURGERY      Family History  Problem Relation Age of Onset   Hypertension Mother    Hypertension Father    Healthy Sister    Lung cancer Maternal Grandmother    Heart attack Maternal Grandfather    Parkinson's disease Paternal Grandmother    Heart attack Paternal Grandfather    Diabetes Paternal Grandfather    Healthy Sister    Healthy Sister     Social History Social History   Tobacco Use   Smoking status: Every Day    Packs/day: 0.50    Types: Cigarettes   Smokeless tobacco: Current  Vaping Use   Vaping Use: Former  Substance Use Topics   Alcohol use: Yes   Drug use: Yes    Types: Marijuana    No Known Allergies  No current outpatient medications on file.   No current facility-administered medications for this visit.     Review of Systems Full ROS  was asked and was negative except for the information on the HPI  Physical Exam Blood pressure 136/83, pulse 96, temperature 98.1 F (36.7 C), temperature source Oral, height 6' (1.829 m),  weight 179 lb 9.6 oz (81.5 kg), SpO2 98 %. CONSTITUTIONAL: NAD. EYES: Pupils are equal, round, , Sclera are non-icteric. EARS, NOSE, MOUTH AND THROAT: He is wearing a mask. Hearing is intact to voice. 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 LYMPH NODES:  Lymph nodes in the neck are normal. No LAD inguinal region. There is a 2 cm node vs EIC Right axilla. No evidence of chest wall , Breast masses or lesions. RESPIRATORY:  Lungs are clear. There is normal respiratory effort, with equal breath sounds bilaterally, and without pathologic use of accessory muscles. CARDIOVASCULAR: Heart is regular without murmurs, gallops, or rubs. GI: The abdomen is  soft, nontender, and nondistended. There are no palpable masses. There is no hepatosplenomegaly. There are normal bowel sounds in all quadrants. GU: Rectal deferred.   MUSCULOSKELETAL: Normal muscle strength and tone. No cyanosis or edema.   SKIN: Turgor is good and there are no pathologic skin lesions or ulcers. NEUROLOGIC: Motor and sensation is grossly normal. Cranial nerves are grossly intact. PSYCH:  Oriented to person, place and time. Affect is normal.  Data Reviewed  I have personally reviewed the patient's imaging, laboratory findings and medical records.  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  DisCussed with the patient in detail that this is probably best managed by at ENT surgeon.  Regarding the right axillary adenopathy vs cyst  I would like to interrogate this further.  We will make sure that we get a CBC to assess his lymph NODE proliferative state and we will also do axillary ultrasound.  This is likely reactive lymph node vs cyst.  From a general surgery perspective no surgical intervention at this time is required.  We will make appropriate referrals to our local ENT group. A copy of this report was sent to the  referring provider  Sterling Big, MD FACS General Surgeon 09/16/2021, 11:04 AM

## 2021-09-17 ENCOUNTER — Ambulatory Visit: Payer: Commercial Managed Care - PPO

## 2021-09-26 ENCOUNTER — Telehealth: Payer: Self-pay

## 2021-09-26 ENCOUNTER — Telehealth: Payer: Self-pay | Admitting: Surgery

## 2021-09-26 NOTE — Telephone Encounter (Signed)
Patient Is calling and said he has not heard back on any of his results. Patient is asking if one of the nurses will give him a call back today. Please call patient and advise.

## 2021-09-26 NOTE — Telephone Encounter (Signed)
Patient would like results of his Ultrasound right axilla 09/16/2021.

## 2021-09-26 NOTE — Telephone Encounter (Signed)
I spoke with patient to let him know that I am waiting on response from Dr.Pabon-it may be tomorrow before I can let him know about the Ultrsaound results.

## 2021-09-30 NOTE — Telephone Encounter (Signed)
Left message for the patient to call back for his ultrasound results.  It is a benign cyst. No major concerns. Labs look good, per Dr Everlene Farrier.

## 2021-11-08 ENCOUNTER — Ambulatory Visit: Admit: 2021-11-08 | Payer: Commercial Managed Care - PPO | Admitting: Unknown Physician Specialty

## 2021-11-08 SURGERY — EXCISION MASS
Anesthesia: General | Laterality: Left

## 2023-03-11 IMAGING — US US AXILLARY RIGHT
1 series · 15 of 25 positions shown · non-contrast
Comparison: None

CLINICAL DATA: Right axillary swelling.

EXAM:
ULTRASOUND OF THE right AXILLA

[Series 1: us breast 10 minutes · 15 of 26 slices shown]
[im 1/26]
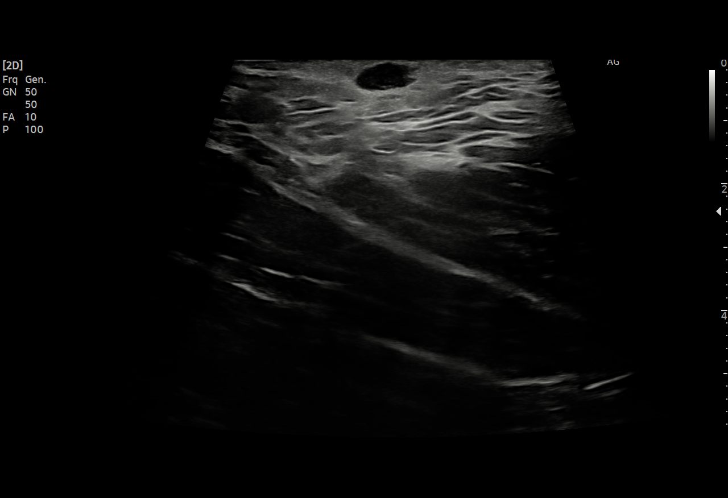
[im 3/26]
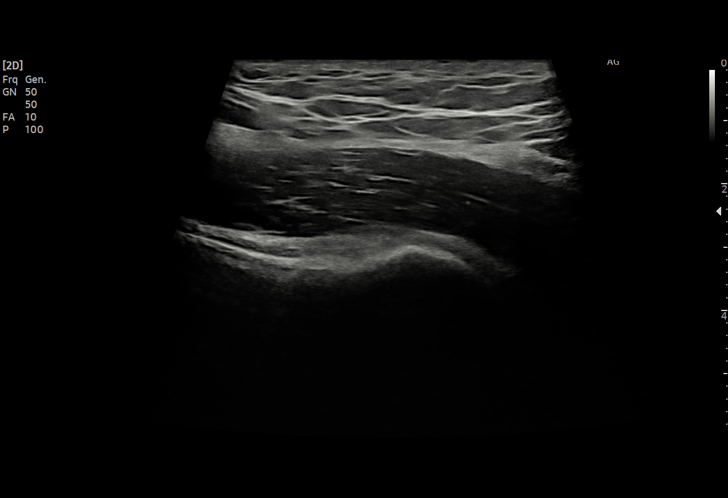
[im 5/26]
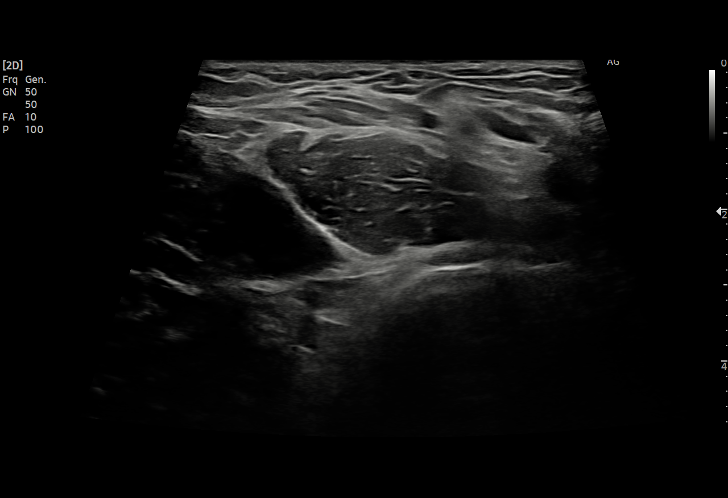
[im 6/26]
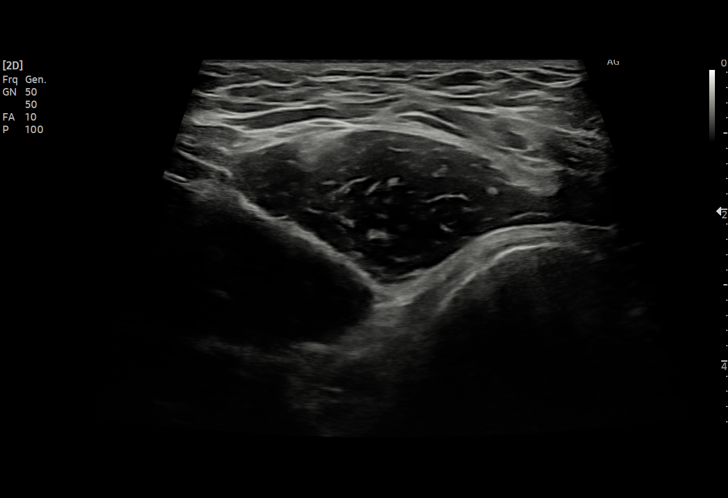
[im 8/26]
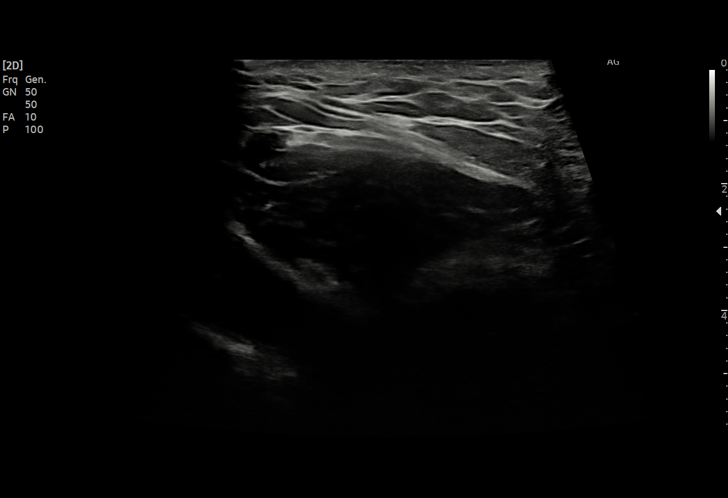
[im 10/26]
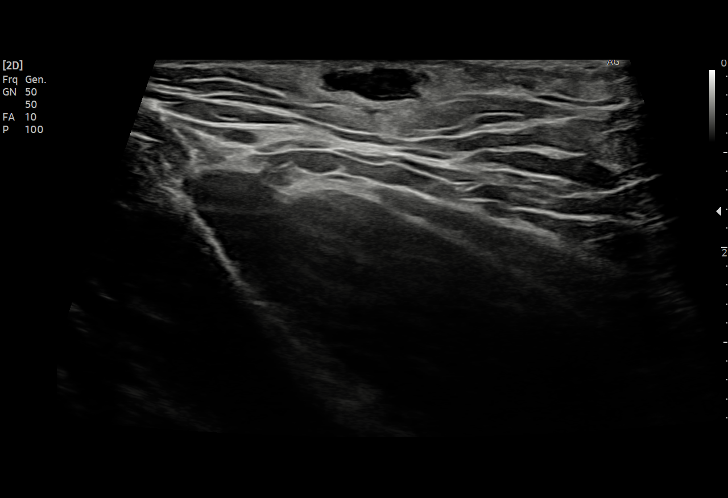
[im 11/26]
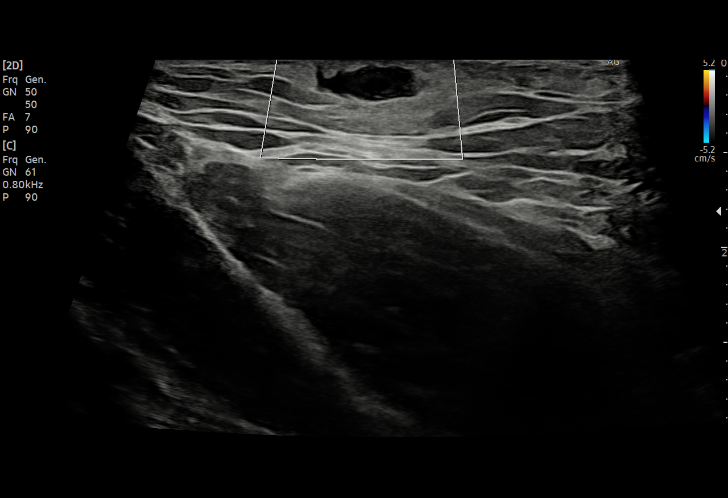
[im 13/26]
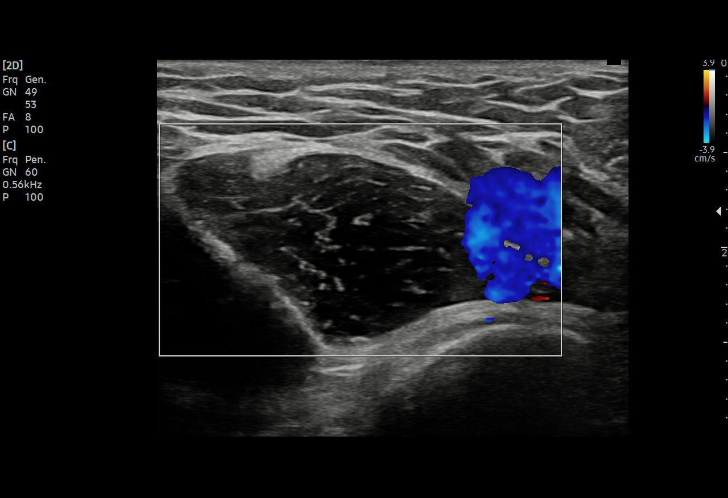
[im 15/26]
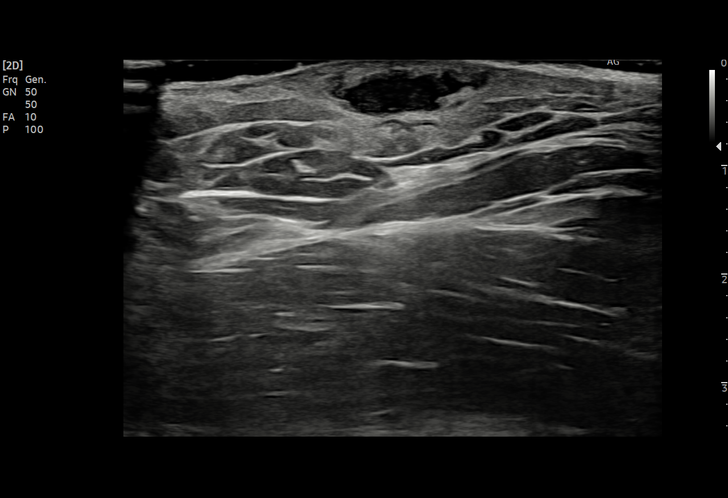
[im 16/26]
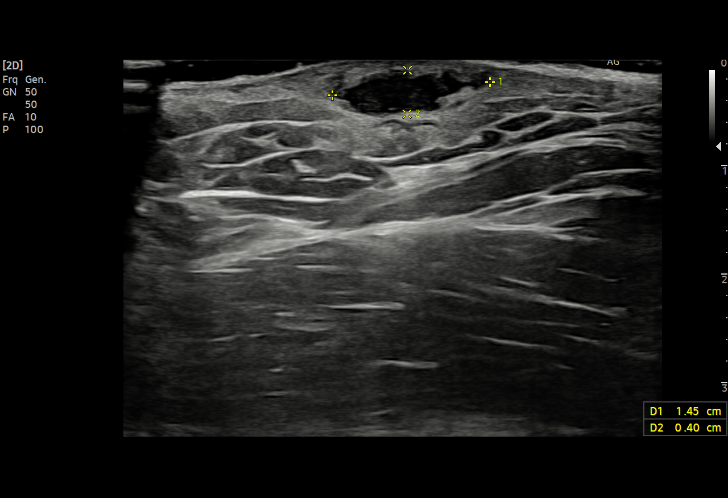
[im 18/26]
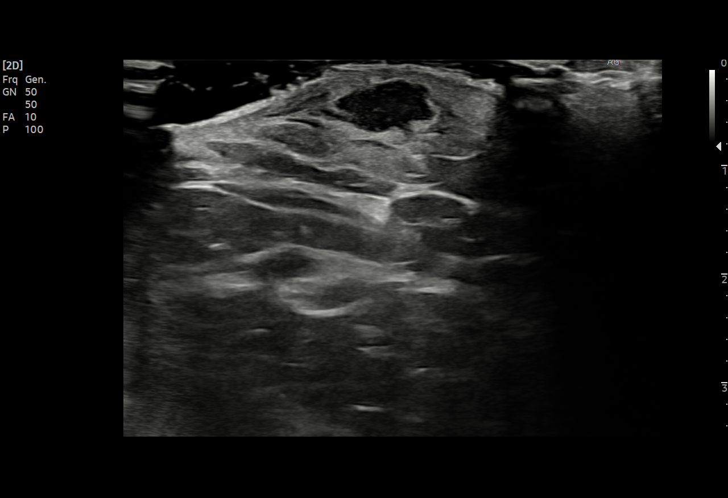
[im 20/26]
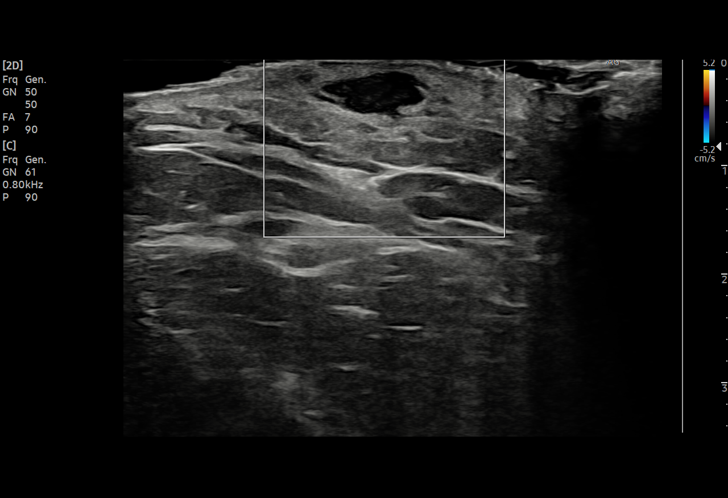
[im 21/26]
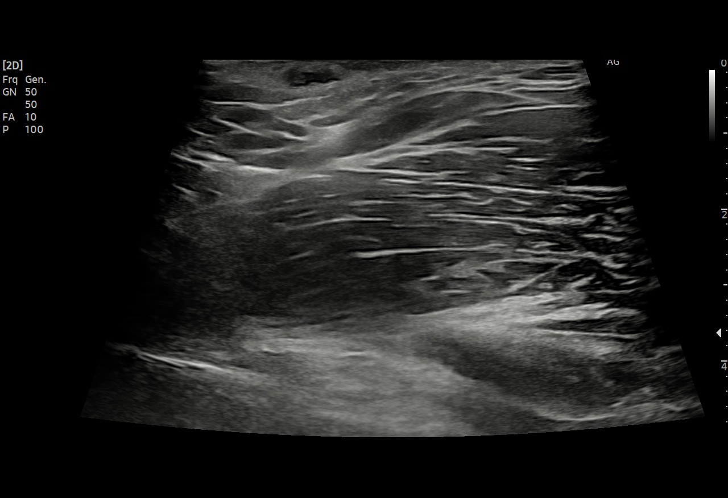
[im 23/26]
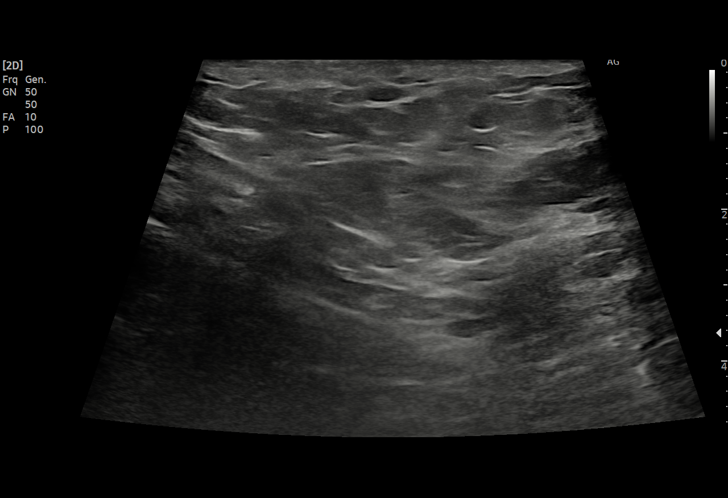
[im 26/26]
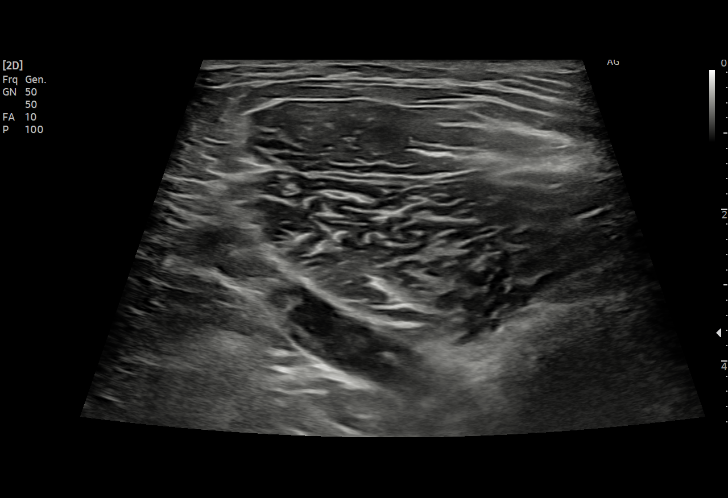

[15 of 25 positions shown; findings below may reference images not displayed]

FINDINGS: Targeted ultrasound was performed over the area of concern. Just
below the skin surface is a small complex fluid collection which
appears to communicate with the skin surface. This measures 1.5 x
0.4 x 1.0 cm. This is predominantly hypoechoic with diffuse
low-level internal echoes. No internal blood flow identified.
IMPRESSION: Small, complex fluid collection is identified within the right
axillary region which appears to communicate with the skin surface.
Differential considerations include small subcutaneous abscess
versus epidermal inclusion cyst. Clinical follow-up is advised. If
this does not resolve or continues to increase in size then this may
be amendable to percutaneous needle aspiration.

## 2024-06-28 ENCOUNTER — Ambulatory Visit: Admitting: Pulmonary Disease

## 2024-08-05 ENCOUNTER — Ambulatory Visit: Payer: Self-pay | Admitting: Family Medicine
